# Patient Record
Sex: Male | Born: 1955 | Race: White | Hispanic: No | Marital: Married | Smoking: Never smoker
Health system: Southern US, Community
[De-identification: ages and names within clinical notes are randomized; demographics above are authoritative.]

## PROBLEM LIST (undated history)

## (undated) DIAGNOSIS — K635 Polyp of colon: Secondary | ICD-10-CM

## (undated) HISTORY — DX: Polyp of colon: K63.5

## (undated) HISTORY — PX: BACK SURGERY: SHX140

## (undated) HISTORY — PX: KNEE SURGERY: SHX244

---

## 2004-09-30 ENCOUNTER — Ambulatory Visit: Payer: Self-pay | Admitting: Family Medicine

## 2005-12-20 ENCOUNTER — Encounter: Payer: Self-pay | Admitting: Gastroenterology

## 2006-01-07 ENCOUNTER — Encounter: Payer: Self-pay | Admitting: Gastroenterology

## 2006-02-17 ENCOUNTER — Encounter: Payer: Self-pay | Admitting: Gastroenterology

## 2006-09-08 ENCOUNTER — Ambulatory Visit (HOSPITAL_COMMUNITY): Admission: RE | Admit: 2006-09-08 | Discharge: 2006-09-08 | Payer: Self-pay | Admitting: Orthopedic Surgery

## 2007-06-12 ENCOUNTER — Ambulatory Visit: Payer: Self-pay | Admitting: Gastroenterology

## 2007-06-14 ENCOUNTER — Encounter: Payer: Self-pay | Admitting: Gastroenterology

## 2007-06-14 ENCOUNTER — Ambulatory Visit: Payer: Self-pay | Admitting: Gastroenterology

## 2007-06-14 DIAGNOSIS — K449 Diaphragmatic hernia without obstruction or gangrene: Secondary | ICD-10-CM | POA: Insufficient documentation

## 2007-06-24 DIAGNOSIS — K210 Gastro-esophageal reflux disease with esophagitis: Secondary | ICD-10-CM

## 2007-08-14 ENCOUNTER — Ambulatory Visit: Payer: Self-pay | Admitting: Gastroenterology

## 2007-10-05 DIAGNOSIS — K219 Gastro-esophageal reflux disease without esophagitis: Secondary | ICD-10-CM | POA: Insufficient documentation

## 2007-10-05 DIAGNOSIS — K5730 Diverticulosis of large intestine without perforation or abscess without bleeding: Secondary | ICD-10-CM | POA: Insufficient documentation

## 2008-07-24 DIAGNOSIS — Z860100 Personal history of colon polyps, unspecified: Secondary | ICD-10-CM | POA: Insufficient documentation

## 2008-07-24 DIAGNOSIS — M129 Arthropathy, unspecified: Secondary | ICD-10-CM | POA: Insufficient documentation

## 2008-07-24 DIAGNOSIS — M5126 Other intervertebral disc displacement, lumbar region: Secondary | ICD-10-CM

## 2008-07-25 ENCOUNTER — Ambulatory Visit: Payer: Self-pay | Admitting: Gastroenterology

## 2008-08-30 ENCOUNTER — Ambulatory Visit (HOSPITAL_COMMUNITY): Admission: RE | Admit: 2008-08-30 | Discharge: 2008-08-30 | Payer: Self-pay | Admitting: Orthopedic Surgery

## 2009-11-05 IMAGING — CR DG ORBITS FOR FOREIGN BODY
2 series · 2 of 2 positions shown · non-contrast
Comparison: 09/08/2006 radiographs.

CLINICAL DATA: Pre MRI orbital screening.

ORBITS FOR FOREIGN BODY - 2 VIEW

[view not recorded (1 of 2)]
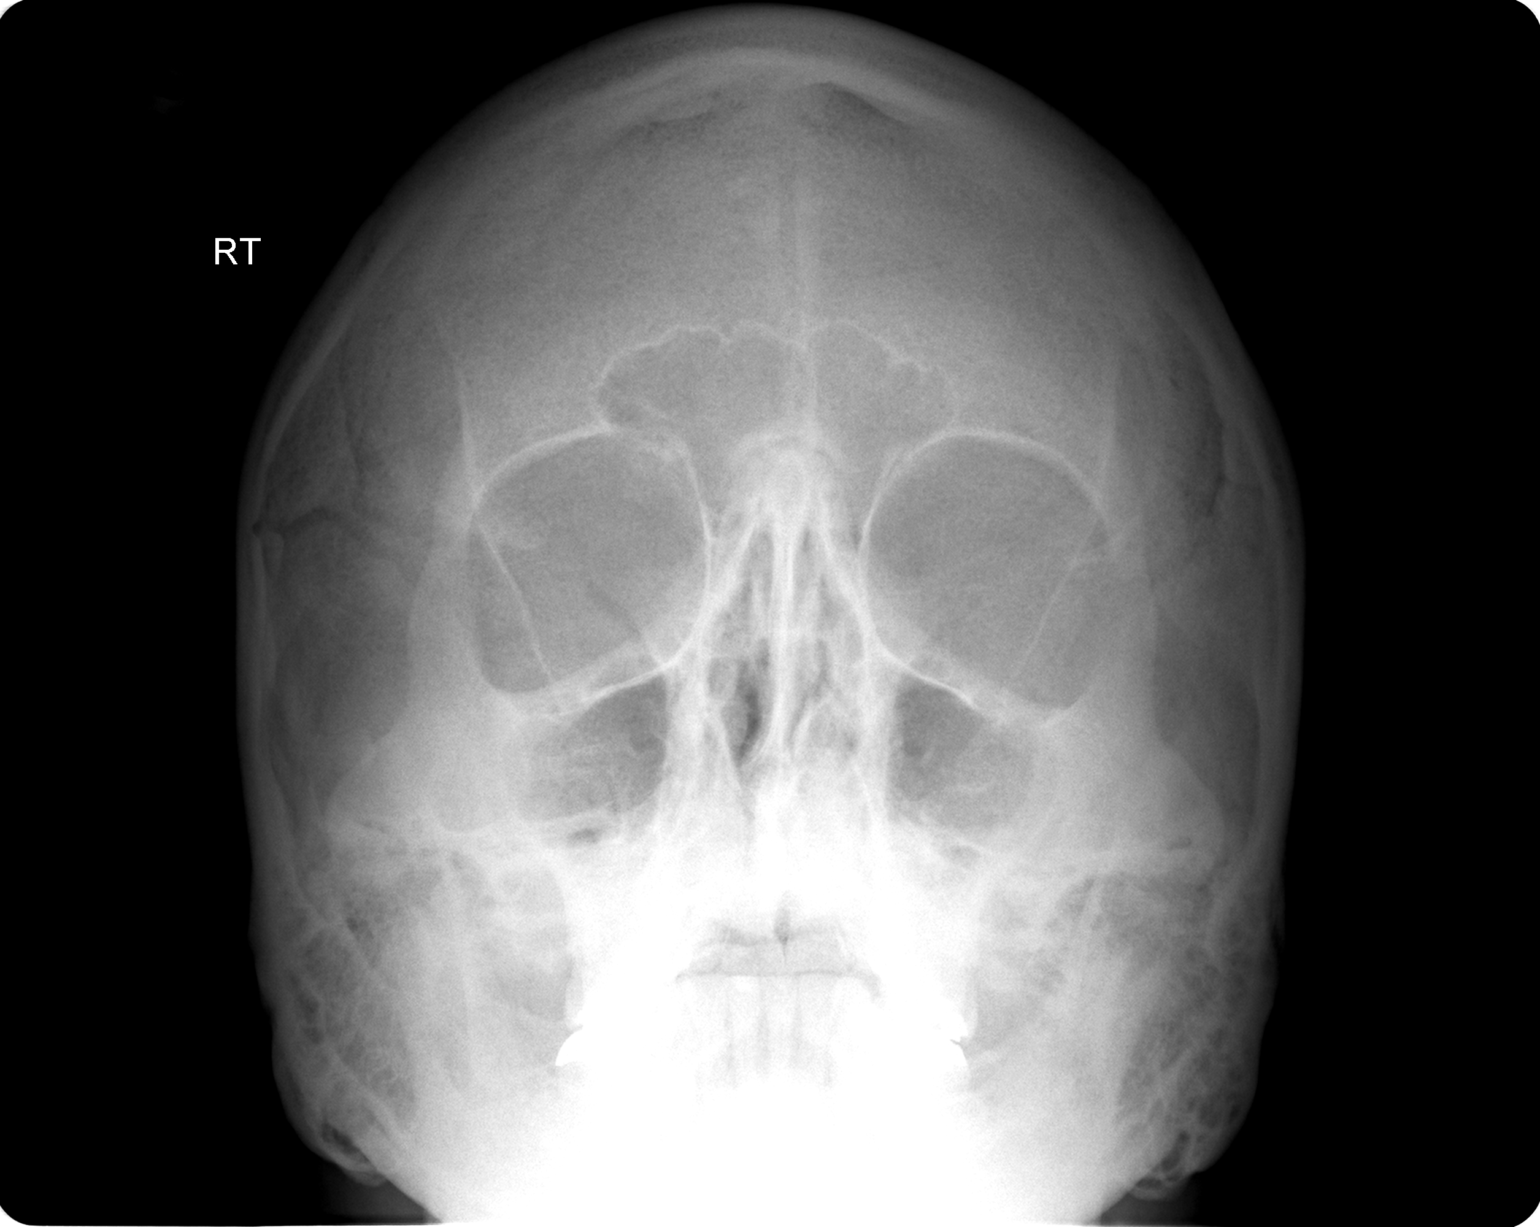

[view not recorded (2 of 2)]
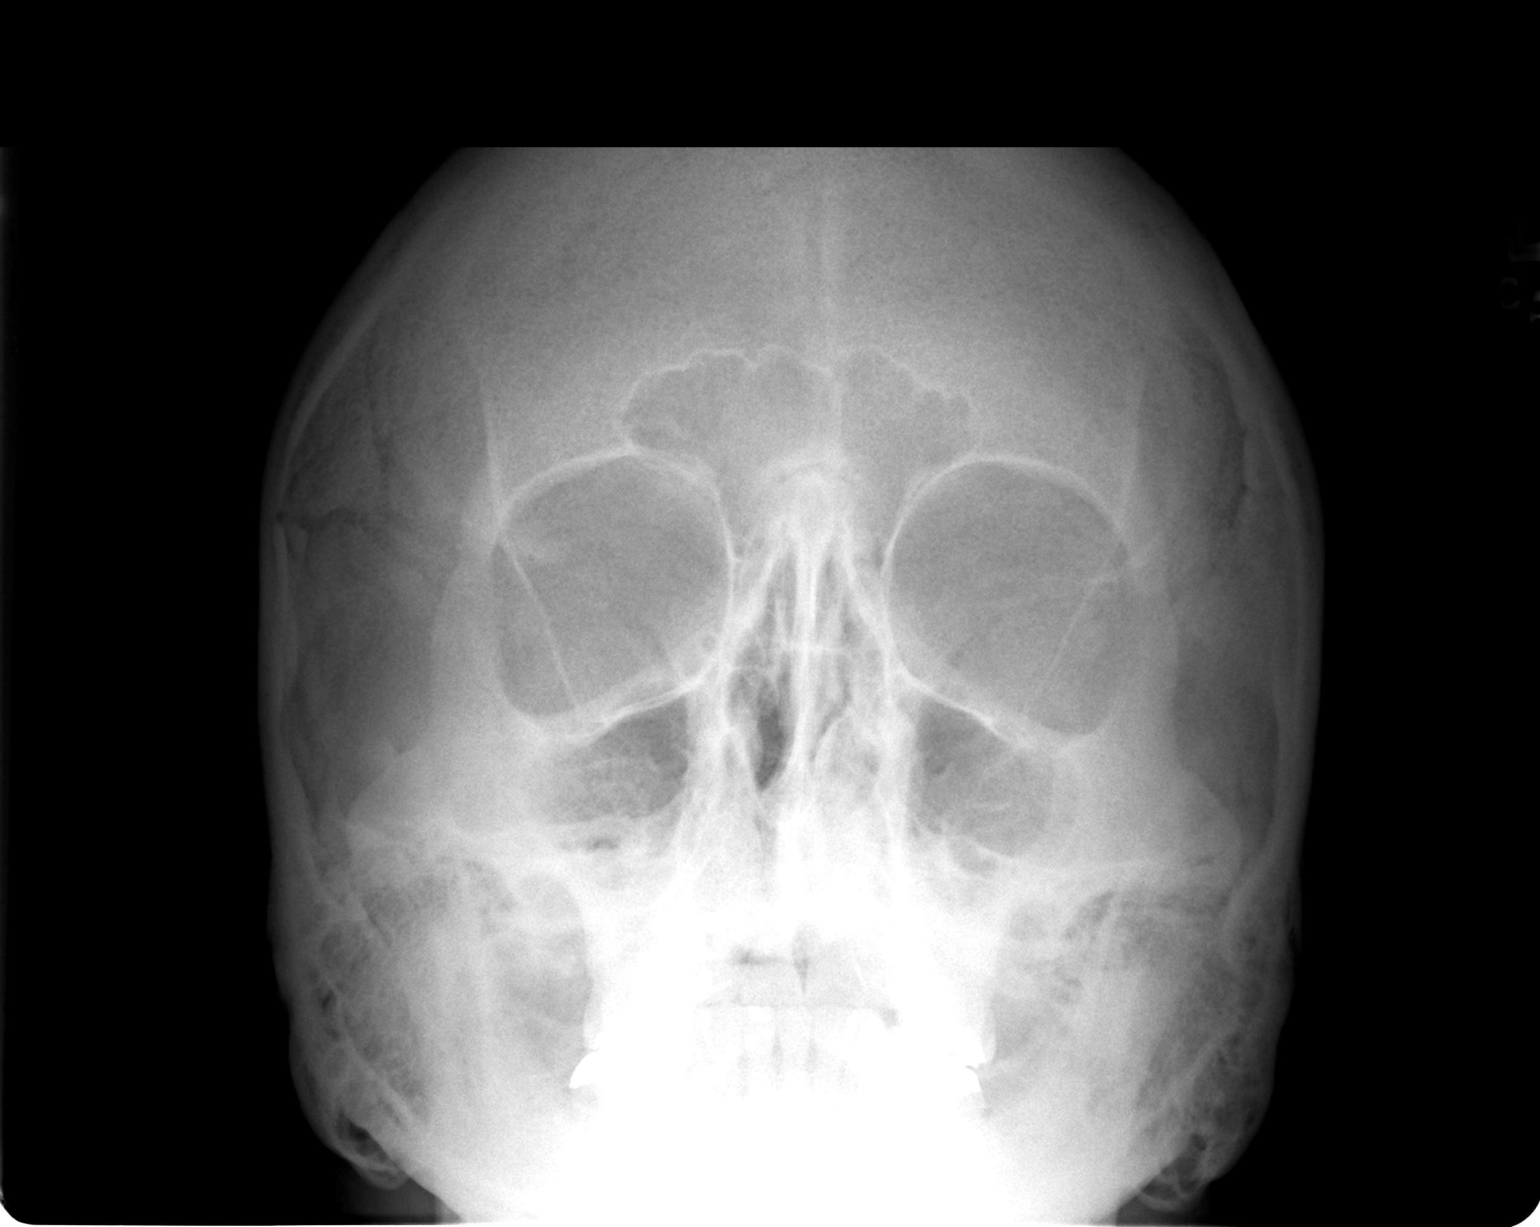

[2 of 2 positions shown; findings below may reference images not displayed]

FINDINGS: There is no evidence of intraorbital metallic foreign
body.  Possible maxillary sinus mucosal thickening is demonstrated
bilaterally.
IMPRESSION: No evidence of intraorbital metallic foreign body.

## 2010-12-22 NOTE — Assessment & Plan Note (Signed)
[facility]                         GASTROENTEROLOGY OFFICE NOTE   NAME:POOLEKashus, [name]                     MRN:          [id number]  DATE:06/12/2007                            DOB:          17-Oct-1955    CHIEF COMPLAINT:  This is A 55 year old white male self referred for  evaluation of GERD.  Mr. Ambrosia relates about a 4-5 year history of post  prandial and nocturnal burning epigastric and some sternal pain.  He has  had frequent problems with postprandial and nighttime cough.  He has  lost about 25 pounds over the past 1-2 years and his reflux symptoms  have improved but have not abated.  He has not been treated with any  antireflux medications.  He notes no dysphagia, odynophagia, change in  bowel habits, melena or hematochezia.  He states he underwent a  colonoscopy by Dr. Chales Abrahams in [place] in 2006 that showed a small  hyperplastic colon polyp and mild diverticulosis and a colonoscopy in 10  years was recommended, by the patients report .   PAST MEDICAL HISTORY:  1. Arthritis.  2. Lumbar spine bulging disk.   CURRENT MEDICATIONS:  None.   MEDICATION ALLERGIES:  NONE KNOWN.   SOCIAL HISTORY:  Per the handwritten form.   REVIEW OF SYSTEMS:  Per the handwritten form.   PHYSICAL EXAMINATION:  Well-developed, well-nourished, no acute  distress.  Height 5 feet 7 inches, weight 163.4 pounds, blood pressure  is 98/60, pulse 60 and regular.  HEENT:  Anicteric sclerae, oropharynx clear.  CHEST:  Clear to auscultation bilaterally.  CARDIAC:  Regular rate and rhythm without murmurs.  ABDOMEN:  Soft, nontender, nondistended, normoactive bowel sounds.  No  palpable organomegaly, masses or hernias.  NEUROLOGIC:  Alert and oriented x3, grossly nonfocal.   ASSESSMENT/PLAN:  1. Gastroesophageal reflux disease.  Begin all standard dietary and      lifestyle measures for reflux.  Begin Prilosec OTC one p.o. q.a.m.      Rule out Barrett's esophagus,  erosive esophagitis and other      disorders.  Risks, benefits and alternatives to upper endoscopy and      possible biopsy discussed with the patient and he consents to      proceed.  This will be      scheduled electively.  2. Mild diverticulosis.  Long term high fiber diet with adequate fluid      intake.  3. Colorectal cancer screening.  Colonoscopy recommended in June 2016.     Venita Lick. Russella Dar, MD, [facility]  Electronically Signed    MTS/MedQ  DD: 06/12/2007  DT: 06/13/2007  Job #: [id number]

## 2010-12-22 NOTE — Assessment & Plan Note (Signed)
[facility]                         GASTROENTEROLOGY OFFICE NOTE   NAME:[name], [name]                     MRN:          [id number]  DATE:08/14/2007                            DOB:          10-01-1955    This is a return office visit for GERD with LA classification grade C  erosive esophagitis and a hiatal hernia.  His reflux symptoms have  improved substantially.  He still has a mild postprandial and nighttime  cough.   CURRENT MEDICATIONS:  Protonix 40 mg p.o. daily.   MEDICATION ALLERGIES:  None known.   EXAM:  No acute distress.  Weight 173.6 pounds.  Blood pressure is  102/64, pulse 64 and regular.  He is not reexamined.   ASSESSMENT AND PLAN:  Gastroesophageal reflux disease with grade C  erosive esophagitis.  He is strongly encouraged to use 4 inch bed blocks  and avoid any oral intake for 3-4 hours before bedtime.  He is advised  to follow all other antireflux measures as well.  He will call if his  cough does not resolve over the next 2-3 months.  Return office visit in  12 months.     Venita Lick. Russella Dar, MD, [facility]  Electronically Signed    MTS/MedQ  DD: 08/14/2007  DT: 08/14/2007  Job #: [id number]

## 2014-05-31 ENCOUNTER — Telehealth: Payer: Self-pay | Admitting: Gastroenterology

## 2014-05-31 NOTE — Telephone Encounter (Signed)
Patient will come in and see Doug SouJessica Zehr, PA on 06/05/14 2:00.  Appointment is scheduled with his mom

## 2014-06-05 ENCOUNTER — Ambulatory Visit (INDEPENDENT_AMBULATORY_CARE_PROVIDER_SITE_OTHER): Payer: Commercial Managed Care - PPO | Admitting: Gastroenterology

## 2014-06-05 ENCOUNTER — Encounter: Payer: Self-pay | Admitting: Gastroenterology

## 2014-06-05 VITALS — BP 130/80 | HR 74 | Ht 65.25 in | Wt 181.4 lb

## 2014-06-05 DIAGNOSIS — K2100 Gastro-esophageal reflux disease with esophagitis, without bleeding: Secondary | ICD-10-CM

## 2014-06-05 DIAGNOSIS — R05 Cough: Secondary | ICD-10-CM | POA: Insufficient documentation

## 2014-06-05 DIAGNOSIS — R059 Cough, unspecified: Secondary | ICD-10-CM

## 2014-06-05 MED ORDER — PANTOPRAZOLE SODIUM 40 MG PO TBEC: 40.0000 mg | DELAYED_RELEASE_TABLET | Freq: Every day | ORAL | Status: DC

## 2014-06-05 NOTE — Patient Instructions (Signed)
You have been scheduled for an endoscopy. Please follow written instructions given to you at your visit today. If you use inhalers (even only as needed), please bring them with you on the day of your procedure. Your physician has requested that you go to www.startemmi.com and enter the access code given to you at your visit today. This web site gives a general overview about your procedure. However, you should still follow specific instructions given to you by our office regarding your preparation for the procedure.  We have sent the following medications to your pharmacy for you to pick up at your convenience: Pantoprazole 40 mg, please take one tablet by mouth thirty minutes before dinner   Information on reflux is below for your review __________________________________________________________________________________________________________________________________________________________________  Gastroesophageal Reflux Disease, Adult Gastroesophageal reflux disease (GERD) happens when acid from your stomach flows up into the esophagus. When acid comes in contact with the esophagus, the acid causes soreness (inflammation) in the esophagus. Over time, GERD may create small holes (ulcers) in the lining of the esophagus. CAUSES   Increased body weight. This puts pressure on the stomach, making acid rise from the stomach into the esophagus.  Smoking. This increases acid production in the stomach.  Drinking alcohol. This causes decreased pressure in the lower esophageal sphincter (valve or ring of muscle between the esophagus and stomach), allowing acid from the stomach into the esophagus.  Late evening meals and a full stomach. This increases pressure and acid production in the stomach.  A malformed lower esophageal sphincter. Sometimes, no cause is found. SYMPTOMS   Burning pain in the lower part of the mid-chest behind the breastbone and in the mid-stomach area. This may occur twice a week  or more often.  Trouble swallowing.  Sore throat.  Dry cough.  Asthma-like symptoms including chest tightness, shortness of breath, or wheezing. DIAGNOSIS  Your caregiver may be able to diagnose GERD based on your symptoms. In some cases, X-rays and other tests may be done to check for complications or to check the condition of your stomach and esophagus. TREATMENT  Your caregiver may recommend over-the-counter or prescription medicines to help decrease acid production. Ask your caregiver before starting or adding any new medicines.  HOME CARE INSTRUCTIONS   Change the factors that you can control. Ask your caregiver for guidance concerning weight loss, quitting smoking, and alcohol consumption.  Avoid foods and drinks that make your symptoms worse, such as:  Caffeine or alcoholic drinks.  Chocolate.  Peppermint or mint flavorings.  Garlic and onions.  Spicy foods.  Citrus fruits, such as oranges, lemons, or limes.  Tomato-based foods such as sauce, chili, salsa, and pizza.  Fried and fatty foods.  Avoid lying down for the 3 hours prior to your bedtime or prior to taking a nap.  Eat small, frequent meals instead of large meals.  Wear loose-fitting clothing. Do not wear anything tight around your waist that causes pressure on your stomach.  Raise the head of your bed 6 to 8 inches with wood blocks to help you sleep. Extra pillows will not help.  Only take over-the-counter or prescription medicines for pain, discomfort, or fever as directed by your caregiver.  Do not take aspirin, ibuprofen, or other nonsteroidal anti-inflammatory drugs (NSAIDs). SEEK IMMEDIATE MEDICAL CARE IF:   You have pain in your arms, neck, jaw, teeth, or back.  Your pain increases or changes in intensity or duration.  You develop nausea, vomiting, or sweating (diaphoresis).  You develop shortness of breath,  or you faint.  Your vomit is green, yellow, black, or looks like coffee grounds or  blood.  Your stool is red, bloody, or black. These symptoms could be signs of other problems, such as heart disease, gastric bleeding, or esophageal bleeding. MAKE SURE YOU:   Understand these instructions.  Will watch your condition.  Will get help right away if you are not doing well or get worse. Document Released: 05/05/2005 Document Revised: 10/18/2011 Document Reviewed: 02/12/2011 [facility] Patient Information 2015 [employer] [place]. This information is not intended to replace advice given to you by your health care provider. Make sure you discuss any questions you have with your health care provider.

## 2014-06-05 NOTE — Progress Notes (Signed)
     06/05/2014 Derek LintsBobby Dean Raymond [id number] 01-Oct-1955   HISTORY OF PRESENT ILLNESS:  This is a pleasant 58 year old male who is previously known to Dr. Russella DarStark for GERD and EGD in 06/2007 at which time he was found to have a hiatal hernia and reflux esophagitis.  Biopsies of the esophagus showed benign mild inflammation c/w reflux.  He has not been on PPI for several years now.  He presents to our office today with complaints of cough and clearing of his throat that has been present for over a year.  He says that it gets worse after he eats.  Also gets reflux at night especially if he eats something too late; gets reflux with spaghetti sauce, etc as well.  Denies any dysphagia or odynophagia.  No nausea or vomiting; no abdominal pain.   Past Medical History  Diagnosis Date  . Colon polyps    Past Surgical History  Procedure Laterality Date  . Back surgery    . Knee surgery Right     reports that he has never smoked. He does not have any smokeless tobacco history on file. He reports that he drinks alcohol. He reports that he does not use illicit drugs. family history includes Diabetes in his father; Heart attack in his mother; Heart disease in his father. There is no history of Colon cancer, Colon polyps, Esophageal cancer, or Kidney disease. No Known Allergies    No outpatient encounter prescriptions on file as of 06/05/2014.     REVIEW OF SYSTEMS  : All other systems reviewed and negative except where noted in the History of Present Illness.   PHYSICAL EXAM: BP 130/80  Pulse 74  Ht 5' 5.25" (1.657 m)  Wt 181 lb 6 oz (82.271 kg)  BMI 29.96 kg/m2 General: Well developed white male in no acute distress Head: Normocephalic and atraumatic Eyes:  Sclerae anicteric, conjunctiva pink. Ears: Normal auditory acuity Lungs: Clear throughout to auscultation Heart: Regular rate and rhythm Abdomen: Soft, non-distended.  Normal bowel sounds.  Non-tender. Musculoskeletal: Symmetrical with  no gross deformities  Skin: No lesions on visible extremities Extremities: No edema  Neurological: Alert oriented x 4, grossly non-focal Psychological:  Alert and cooperative. Normal mood and affect  ASSESSMENT AND PLAN: -58 year old male with history of GERD and reflux esophagitis on previous EGD.  Now with complaints of coughing and clearing his throat.  Not on PPI for several years.  Will start pantoprazole 40 mg daily before dinner since symptoms are worse in the evening and at night.  GERD reflux measures.  Will schedule for repeat EGD as well to reassess, rule out Barrett's from long-standing untreated reflux, etc.

## 2014-06-05 NOTE — Progress Notes (Signed)
Reviewed and agree with management plan.  Marston Mccadden T. Makyi Ledo, MD FACG 

## 2014-06-06 ENCOUNTER — Encounter: Payer: Self-pay | Admitting: Gastroenterology

## 2014-07-24 ENCOUNTER — Telehealth: Payer: Self-pay | Admitting: Gastroenterology

## 2014-07-24 NOTE — Telephone Encounter (Signed)
Patient notified that as long as he is not running fever he is ok to keep appt.  He wants to keep the appt as scheduled

## 2014-07-26 ENCOUNTER — Encounter: Payer: Self-pay | Admitting: Gastroenterology

## 2014-07-26 ENCOUNTER — Ambulatory Visit (AMBULATORY_SURGERY_CENTER): Payer: Commercial Managed Care - PPO | Admitting: Gastroenterology

## 2014-07-26 VITALS — BP 123/87 | HR 61 | Temp 96.5°F | Resp 16 | Ht 65.25 in | Wt 181.0 lb

## 2014-07-26 DIAGNOSIS — K209 Esophagitis, unspecified: Secondary | ICD-10-CM

## 2014-07-26 DIAGNOSIS — K2100 Gastro-esophageal reflux disease with esophagitis, without bleeding: Secondary | ICD-10-CM

## 2014-07-26 MED ORDER — OMEPRAZOLE 40 MG PO CPDR: 40.0000 mg | DELAYED_RELEASE_CAPSULE | Freq: Two times a day (BID) | ORAL | Status: DC

## 2014-07-26 MED ORDER — SODIUM CHLORIDE 0.9 % IV SOLN: 500.0000 mL | INTRAVENOUS | Status: DC

## 2014-07-26 NOTE — Progress Notes (Signed)
Report to PACU, RN, vss, BBS= Clear.  

## 2014-07-26 NOTE — Progress Notes (Signed)
Called to room to assist during endoscopic procedure.  Patient ID and intended procedure confirmed with present staff. Received instructions for my participation in the procedure from the performing physician.  

## 2014-07-26 NOTE — Op Note (Signed)
[facility] [address] [place], [id number]   ENDOSCOPY PROCEDURE REPORT  PATIENT: [name], [name]  MR#: [id number] BIRTHDATE: 1956/02/19 , 58  yrs. old GENDER: male ENDOSCOPIST: Meryl DareMalcolm T Song Garris, MD, [facility] PROCEDURE DATE:  07/26/2014 PROCEDURE:  EGD w/ biopsy ASA CLASS:     Class II INDICATIONS:  history of reflux esophagitis. MEDICATIONS: Monitored anesthesia care and Propofol 200 mg IV TOPICAL ANESTHETIC: none DESCRIPTION OF PROCEDURE: After the risks benefits and alternatives of the procedure were thoroughly explained, informed consent was obtained.  The LB [id number] endoscope was introduced through the mouth and advanced to the second portion of the duodenum , Without limitations.  The instrument was slowly withdrawn as the mucosa was fully examined.    ESOPHAGUS: There was LA Class C esophagitis (Mucosal breaks continuous between > 2 mucosal folds, but involving less than 75% of the esophageal circumference) noted.   The esophagus was otherwise normal.   Multiple biopsies of esophagitis were performed. STOMACH: The mucosa of the stomach appeared normal. DUODENUM: The duodenal mucosa showed no abnormalities in the bulb and 2nd part of the duodenum.  Retroflexed views revealed a 4 cm hiatal hernia.   The scope was then withdrawn from the patient and the procedure completed.  COMPLICATIONS: There were no immediate complications.  ENDOSCOPIC IMPRESSION: 1.   LA Class C esophagitis 2.   4 cm hiatal hernia  RECOMMENDATIONS: 1.  Anti-reflux regimen long term 2.  Await pathology results 3.  PPI bid: omeprazole 40 mg po bid (ac), 1 year of refills 4.  OP follow-up in 4-6 weeks  eSigned:  Meryl DareMalcolm T Neala Miggins, MD, [facility] 07/26/2014 2:54 PM

## 2014-07-26 NOTE — Patient Instructions (Addendum)

## 2014-07-29 ENCOUNTER — Telehealth: Payer: Self-pay

## 2014-07-29 NOTE — Telephone Encounter (Signed)
Left a message at [phone] for the pt to call us back if any questions or concerns. maw

## 2014-08-05 ENCOUNTER — Encounter: Payer: Self-pay | Admitting: Gastroenterology

## 2015-12-15 ENCOUNTER — Encounter: Payer: Self-pay | Admitting: Gastroenterology

## 2019-08-10 HISTORY — PX: SHOULDER SURGERY: SHX246

## 2023-08-16 DIAGNOSIS — D170 Benign lipomatous neoplasm of skin and subcutaneous tissue of head, face and neck: Secondary | ICD-10-CM | POA: Diagnosis not present

## 2023-08-16 DIAGNOSIS — K219 Gastro-esophageal reflux disease without esophagitis: Secondary | ICD-10-CM | POA: Diagnosis not present

## 2023-08-16 DIAGNOSIS — R1310 Dysphagia, unspecified: Secondary | ICD-10-CM | POA: Diagnosis not present

## 2023-08-26 ENCOUNTER — Encounter: Payer: Self-pay | Admitting: Gastroenterology

## 2023-10-06 NOTE — Progress Notes (Signed)
 Chief Complaint:dysphagia Primary GI Doctor:(Previously [name]) requests [name]  HPI:  Patient is a  68 year old male patient with past medical history of diverticulosis, hiatal hernia, GERD with esophagitis, who was referred to me by Derek Payment., MD on 08/17/23 for a complaint of dysphagia .    06/05/2014 patient last seen by Derek Bumps, PA with complaints of cough and clearing of his throat.  He also complained of reflux especially at night.  Patient scheduled for EGD and placed on pantoprazole 40 mg p.o. daily.  08/17/2023 PCP mentions patient having issues with dysphagia.  He also mentions lesion on the back of his neck that is a benign looking lipoma.  Not causing him significant pain or discomfort.  They did discuss indications for surgical excision or removal.  Interval History    Patient presents with main complaint of esophageal dysphagia. Patient has a history of GERD and currently taking omeprazole 40 mg p.o. daily. He states he started it about a month ago when he told his PCP about his dysphagia. His primary problem is esophageal dysphagia for the past year, but it has progressively gotten worse. The Omeprazole has helped.Patient complains of esophageal dysphagia with solids only. Occasional regurgitation during the night when sleeping. Patient denies nausea, vomiting, or weight loss. Patient reports he also has issues with having complete bowel movement and constipation. He states at times he feels like he needs to go, but doesn't. He has daily bowel movement, strains at times. Sometimes he has large bowel movement, other times very small amount. No blood in stool. No abdominal pain. No blood thinners. Nonsmoker. Socially drinks, very seldom. Patient's family history includes sister with breast CA.  Wt Readings from Last 3 Encounters:  10/10/23 175 lb 2 oz (79.4 kg)  07/26/14 181 lb (82.1 kg)  06/05/14 181 lb 6 oz (82.3 kg)    Past Medical History:  Diagnosis Date   Colon  polyps    Past Surgical History:  Procedure Laterality Date   BACK SURGERY     KNEE SURGERY Right    2 times   SHOULDER SURGERY Right 2021   Current Outpatient Medications  Medication Sig Dispense Refill   omeprazole (PRILOSEC) 40 MG capsule Take 1 capsule (40 mg total) by mouth 2 (two) times daily before a meal. 90 capsule 3   No current facility-administered medications for this visit.    Allergies as of 10/10/2023 - Review Complete 07/26/2014  Allergen Reaction Noted   Pantoprazole Nausea Only 07/26/2014    Family History  Problem Relation Age of Onset   Heart attack Mother    Diabetes Father    Heart disease Father    Breast cancer Sister    Colon cancer Neg Hx    Colon polyps Neg Hx    Esophageal cancer Neg Hx    Kidney disease Neg Hx     Review of Systems:    Constitutional: No weight loss, fever, chills, weakness or fatigue HEENT: Eyes: No change in vision               Ears, Nose, Throat:  No change in hearing or congestion Skin: No rash or itching Cardiovascular: No chest pain, chest pressure or palpitations   Respiratory: No SOB or cough Gastrointestinal: See HPI and otherwise negative Genitourinary: No dysuria or change in urinary frequency Neurological: No headache, dizziness or syncope Musculoskeletal: No new muscle or joint pain Hematologic: No bleeding or bruising Psychiatric: No history of depression or anxiety  Physical Exam:  Vital signs: BP 120/68   Pulse 60   Ht 5\' 5"  (1.651 m)   Wt 175 lb 2 oz (79.4 kg)   BMI 29.14 kg/m   Constitutional:   Pleasant Caucasian male appears to be in NAD, Well developed, Well nourished, alert and cooperative Throat: Oral cavity and pharynx without inflammation, swelling or lesion.  Respiratory: Respirations even and unlabored. Lungs clear to auscultation bilaterally.   No wheezes, crackles, or rhonchi.  Cardiovascular: Normal S1, S2. Regular rate and rhythm. No peripheral edema, cyanosis or pallor.   Gastrointestinal:  Soft, nondistended, nontender. No rebound or guarding. Normal bowel sounds. No appreciable masses or hepatomegaly. Rectal:  Not performed.  Msk:  Symmetrical without gross deformities. Without edema, no deformity or joint abnormality.  Neurologic:  Alert and  oriented x4;  grossly normal neurologically.  Skin:   Dry and intact without significant lesions or rashes. Psychiatric: Oriented to person, place and time. Demonstrates good judgement and reason without abnormal affect or behaviors.  RELEVANT LABS AND IMAGING: Procedures: 07/26/2014 upper endoscopy with [name] Impression LA class C esophagitis 4 cm hiatal hernia 06/14/2007 upper endoscopy with [name] Esophageal: severe ulcerations present [facility] classification grade C biopsy/E soft inflammation taken Hiatal hernia; regular 4 cm in length Normal fundus to duodenal second portion 02/17/2006 colonoscopy with [name] Findings Pan colitic diverticulosis, predominantly in the left colon Otherwise normal colonoscopy of terminal ileum  Assessment: Encounter Diagnoses  Name Primary?   Gastroesophageal reflux disease with esophagitis without hemorrhage Yes   Esophageal dysphagia    Special screening for malignant neoplasms, colon    Chronic idiopathic constipation       68 year old male patient with history of GERD with esophagitis who presents with worsening esophageal dysphagia for the past year.  Patient has not been on any PPI therapy.  PCP started him on omeprazole 40 mg p.o. daily 1 month ago.  Patient states his symptoms have improved some.  Patient has nocturnal symptoms with regurgitation.  Will go ahead and schedule upper GI endoscopy to rule out Barrett's esophagus.  Reinforced strict GERD diet no late meals.     Patient also complains of mild constipation and we discussed adding Citrucel 1 teaspoon p.o. daily.  Patient can use MiraLAX as needed.    Patient due for colon screening colonoscopy, his  last colonoscopy was with [name] in 2007 and normal.  We will use a 2-day prep.  Plan: -Continue Omeprazole 40 mg po daily -Strict GERD diet, no meals 3 to 4 hours before lying down -Start Citrucel 1 tsp po daily -OTC Miralax po daily as needed -Schedule EGD with possible dilatation in [facility] with [name].The risks and benefits of EGD with possible biopsies and esophageal dilation were discussed with the patient who agrees to proceed. -Schedule a colonoscopy in [facility] with [name]. Two day prep. The risks and benefits of colonoscopy with possible polypectomy / biopsies were discussed and the patient agrees to proceed.    Thank you for the courtesy of this consult. Please call me with any questions or concerns.   Derek Hainer, FNP-C [facility] 10/10/2023, 9:34 AM  Cc: Derek Payment., MD

## 2023-10-10 ENCOUNTER — Encounter: Payer: Self-pay | Admitting: Gastroenterology

## 2023-10-10 ENCOUNTER — Ambulatory Visit (INDEPENDENT_AMBULATORY_CARE_PROVIDER_SITE_OTHER): Payer: Commercial Managed Care - PPO | Admitting: Gastroenterology

## 2023-10-10 VITALS — BP 120/68 | HR 60 | Ht 65.0 in | Wt 175.1 lb

## 2023-10-10 DIAGNOSIS — R1319 Other dysphagia: Secondary | ICD-10-CM

## 2023-10-10 DIAGNOSIS — K5904 Chronic idiopathic constipation: Secondary | ICD-10-CM | POA: Diagnosis not present

## 2023-10-10 DIAGNOSIS — Z1211 Encounter for screening for malignant neoplasm of colon: Secondary | ICD-10-CM

## 2023-10-10 DIAGNOSIS — K2100 Gastro-esophageal reflux disease with esophagitis, without bleeding: Secondary | ICD-10-CM

## 2023-10-10 DIAGNOSIS — R1310 Dysphagia, unspecified: Secondary | ICD-10-CM

## 2023-10-10 MED ORDER — SUFLAVE 178.7 G PO SOLR: 1.0000 | Freq: Once | ORAL | 0 refills | Status: AC

## 2023-10-10 NOTE — Progress Notes (Signed)
 Agree with assessment/plan.  Edman Circle, MD [facility] [phone]

## 2023-10-10 NOTE — Patient Instructions (Addendum)
 Continue Omeprazole 40 mg po daily. Strict GERD diet, no meals 3 to 4 hours before lying down. Start Citrucel 1 teaspoon po daily. Over the counter Miralax po daily as needed for constipation  You have been scheduled for an endoscopy and colonoscopy. Please follow the written instructions given to you at your visit today.  If you use inhalers (even only as needed), please bring them with you on the day of your procedure.  DO NOT TAKE 7 DAYS PRIOR TO TEST- Trulicity (dulaglutide) Ozempic, Wegovy (semaglutide) Mounjaro (tirzepatide) Bydureon Bcise (exanatide extended release)  DO NOT TAKE 1 DAY PRIOR TO YOUR TEST Rybelsus (semaglutide) Adlyxin (lixisenatide) Victoza (liraglutide) Byetta (exanatide) ___________________________________________________________________________  Bonita Quin will receive your bowel preparation through [facility], which ensures the lowest copay and home delivery, with outreach via text or call from an [phone] number. Please respond promptly to avoid rescheduling of your procedure. If you are interested in alternative options or have any questions regarding your prep, please contact them at [phone] ____________________________________________________________________________  Your Provider Has Sent Your Bowel Prep Regimen To [facility]   [facility] will contact you to verify your information and collect your copay, if applicable. Enjoy the comfort of your home while your prescription is mailed to you, FREE of any shipping charges.   [facility] accepts all major insurance benefits and applies discounts & coupons.  Have additional questions?   Chat: [email or web address] Call: [phone] Email: [email or web address] NCPDP: [id number]  How will [facility] contact you?  With a Welcome phone call,  a Welcome text and a checkout link in text form.  Texts you receive from [phone] Are NOT Spam.  *To set up delivery, you must complete the checkout  process via link or speak to one of the patient care representatives. If [facility] is unable to reach you, your prescription may be delayed.  To avoid long hold times on the phone, you may also utilize the secure chat feature on the [facility] website to request that they call you back for transaction completion or to expedite your concerns.  _______________________________________________________  If your blood pressure at your visit was 140/90 or greater, please contact your primary care physician to follow up on this.  _______________________________________________________  If you are age 66 or older, your body mass index should be between 23-30. Your Body mass index is 29.14 kg/m. If this is out of the aforementioned range listed, please consider follow up with your Primary Care Provider.  If you are age 80 or younger, your body mass index should be between 19-25. Your Body mass index is 29.14 kg/m. If this is out of the aformentioned range listed, please consider follow up with your Primary Care Provider.   ________________________________________________________  The [facility] providers would like to encourage you to use [facility] to communicate with providers for non-urgent requests or questions.  Due to long hold times on the telephone, sending your provider a message by [facility] may be a faster and more efficient way to get a response.  Please allow 48 business hours for a response.  Please remember that this is for non-urgent requests.  _______________________________________________________  Due to recent changes in healthcare laws, you may see the results of your imaging and laboratory studies on MyChart before your provider has had a chance to review them.  We understand that in some cases there may be results that are confusing or concerning to you. Not all laboratory results come back in the same time frame and the provider may be waiting for multiple results  in order to interpret  others.  Please give [place] 48 hours in order for your provider to thoroughly review all the results before contacting the office for clarification of your results.   Thank you for entrusting me with your care and choosing [facility].  Deanna May NP

## 2023-12-12 ENCOUNTER — Encounter: Payer: Self-pay | Admitting: Gastroenterology

## 2023-12-12 ENCOUNTER — Ambulatory Visit (AMBULATORY_SURGERY_CENTER): Admitting: Gastroenterology

## 2023-12-12 VITALS — BP 120/74 | HR 55 | Temp 97.9°F | Resp 12 | Ht 65.0 in | Wt 175.0 lb

## 2023-12-12 DIAGNOSIS — K2950 Unspecified chronic gastritis without bleeding: Secondary | ICD-10-CM | POA: Diagnosis not present

## 2023-12-12 DIAGNOSIS — K449 Diaphragmatic hernia without obstruction or gangrene: Secondary | ICD-10-CM | POA: Diagnosis not present

## 2023-12-12 DIAGNOSIS — Z1211 Encounter for screening for malignant neoplasm of colon: Secondary | ICD-10-CM | POA: Diagnosis not present

## 2023-12-12 DIAGNOSIS — K209 Esophagitis, unspecified without bleeding: Secondary | ICD-10-CM

## 2023-12-12 DIAGNOSIS — K5730 Diverticulosis of large intestine without perforation or abscess without bleeding: Secondary | ICD-10-CM | POA: Diagnosis not present

## 2023-12-12 DIAGNOSIS — K2210 Ulcer of esophagus without bleeding: Secondary | ICD-10-CM

## 2023-12-12 DIAGNOSIS — K2100 Gastro-esophageal reflux disease with esophagitis, without bleeding: Secondary | ICD-10-CM

## 2023-12-12 DIAGNOSIS — K222 Esophageal obstruction: Secondary | ICD-10-CM | POA: Diagnosis not present

## 2023-12-12 DIAGNOSIS — K640 First degree hemorrhoids: Secondary | ICD-10-CM

## 2023-12-12 MED ORDER — SODIUM CHLORIDE 0.9 % IV SOLN: 500.0000 mL | Freq: Once | INTRAVENOUS | Status: DC

## 2023-12-12 MED ORDER — OMEPRAZOLE 40 MG PO CPDR: 40.0000 mg | DELAYED_RELEASE_CAPSULE | Freq: Two times a day (BID) | ORAL | 4 refills | Status: AC

## 2023-12-12 NOTE — Progress Notes (Signed)
 Called to room to assist during endoscopic procedure.  Patient ID and intended procedure confirmed with present staff. Received instructions for my participation in the procedure from the performing physician.

## 2023-12-12 NOTE — Op Note (Signed)
 [facility] Patient Name: [name] Procedure Date: 12/12/2023 9:06 AM MRN: [id number] Endoscopist: Lajuan Pila , MD, [id number] Age: 68 Referring MD:  Date of Birth: 11/26/1955 Gender: Male Account #: [id number] Procedure:                Colonoscopy Indications:              Screening for colorectal malignant neoplasm Medicines:                Monitored Anesthesia Care Procedure:                Pre-Anesthesia Assessment:                           - Prior to the procedure, a History and Physical                            was performed, and patient medications and                            allergies were reviewed. The patient's tolerance of                            previous anesthesia was also reviewed. The risks                            and benefits of the procedure and the sedation                            options and risks were discussed with the patient.                            All questions were answered, and informed consent                            was obtained. Prior Anticoagulants: The patient has                            taken no anticoagulant or antiplatelet agents. ASA                            Grade Assessment: II - A patient with mild systemic                            disease. After reviewing the risks and benefits,                            the patient was deemed in satisfactory condition to                            undergo the procedure.                           After obtaining informed consent, the colonoscope  was passed under direct vision. Throughout the                            procedure, the patient's blood pressure, pulse, and                            oxygen saturations were monitored continuously. The                            Olympus Scope [id number] was introduced through the                            anus and advanced to the 2 cm into the ileum. The                            colonoscopy was  performed without difficulty. The                            patient tolerated the procedure well. The quality                            of the bowel preparation was good. The terminal                            ileum, ileocecal valve, appendiceal orifice, and                            rectum were photographed. Scope In: 9:12:27 AM Scope Out: 9:22:26 AM Scope Withdrawal Time: 0 hours 7 minutes 10 seconds  Total Procedure Duration: 0 hours 9 minutes 59 seconds  Findings:                 Multiple medium-mouthed diverticula were found in                            the sigmoid colon, few in descending colon and                            ascending colon.                           Non-bleeding internal hemorrhoids were found during                            retroflexion. The hemorrhoids were small and Grade                            I (internal hemorrhoids that do not prolapse).                           The terminal ileum appeared normal.                           The exam was otherwise without abnormality on  direct and retroflexion views. Complications:            No immediate complications. Estimated Blood Loss:     Estimated blood loss: none. Impression:               - Pancolonic diverticulosis predominantly in the                            sigmoid colon.                           - Non-bleeding internal hemorrhoids.                           - The examined portion of the ileum was normal.                           - The examination was otherwise normal on direct                            and retroflexion views.                           - No specimens collected. Recommendation:           - Patient has a contact number available for                            emergencies. The signs and symptoms of potential                            delayed complications were discussed with the                            patient. Return to normal activities tomorrow.                             Written discharge instructions were provided to the                            patient.                           - High fiber diet.                           - Continue present medications.                           - Repeat colonoscopy in 10 years for screening                            purposes. Earlier, if with any new problems or                            change in family history.                           -  The findings and recommendations were discussed                            with the patient's family. Lajuan Pila, MD 12/12/2023 9:31:02 AM This report has been signed electronically.

## 2023-12-12 NOTE — Patient Instructions (Addendum)
 SEE POST DILATION DIET HANDOUT, SOFT DIET FOR TODAY! Resume previous diet TOMORROW! Continue present medications, PICK UP NEW RX FOR PRILOSEC There were no colon polyps seen today!  You will need another screening colonoscopy in 10 years, you will receive a letter at that time when you are due for the procedure.   Please call us  at [phone] if you have a change in bowel habits, change in family history of colo-rectal cancer, rectal bleeding or other GI concern before that time. Await pathology results Repeat EGD in 12 weeks, you will get a reminder letter to schedule or call in the next few weeks when the schedule will open for early August.   Handouts/information given for Hiatal Hernia, stricture, esophagitis, Gastritis, diverticulosis and hemorrhoids  YOU HAD AN ENDOSCOPIC PROCEDURE TODAY AT THE [facility]:   Refer to the procedure report that was given to you for any specific questions about what was found during the examination.  If the procedure report does not answer your questions, please call your gastroenterologist to clarify.  If you requested that your care partner not be given the details of your procedure findings, then the procedure report has been included in a sealed envelope for you to review at your convenience later.  YOU SHOULD EXPECT: Some feelings of bloating in the abdomen. Passage of more gas than usual.  Walking can help get rid of the air that was put into your GI tract during the procedure and reduce the bloating. If you had a lower endoscopy (such as a colonoscopy or flexible sigmoidoscopy) you may notice spotting of blood in your stool or on the toilet paper. If you underwent a bowel prep for your procedure, you may not have a normal bowel movement for a few days.  Please Note:  You might notice some irritation and congestion in your nose or some drainage.  This is from the oxygen used during your procedure.  There is no need for concern and it should  clear up in a day or so.  SYMPTOMS TO REPORT IMMEDIATELY:  Following lower endoscopy (colonoscopy):  Excessive amounts of blood in the stool  Significant tenderness or worsening of abdominal pains  Swelling of the abdomen that is new, acute  Fever of 100F or higher Following upper endoscopy (EGD)  Vomiting of blood or coffee ground material  New chest pain or pain under the shoulder blades  Painful or persistently difficult swallowing  New shortness of breath  Black, tarry-looking stools For urgent or emergent issues, a gastroenterologist can be reached at any hour by calling [phone]. Do not use MyChart messaging for urgent concerns.   DIET: SEE POST DILATION DIET HANDOUT! SOFT DIET TODAY!   We do recommend a small meal at first, but then you may proceed to your regular diet.  Drink plenty of fluids but you should avoid alcoholic beverages for 24 hours.  ACTIVITY:  You should plan to take it easy for the rest of today and you should NOT DRIVE or use heavy machinery until tomorrow (because of the sedation medicines used during the test).    FOLLOW UP: Our staff will call the number listed on your records the next business day following your procedure.  We will call around 7:15- 8:00 am to check on you and address any questions or concerns that you may have regarding the information given to you following your procedure. If we do not reach you, we will leave a message.  If any biopsies were taken you will be contacted by phone or by letter within the next 1-3 weeks.  Please call us  at [phone] if you have not heard about the biopsies in 3 weeks.   SIGNATURES/CONFIDENTIALITY: You and/or your care partner have signed paperwork which will be entered into your electronic medical record.  These signatures attest to the fact that that the information above on your After Visit Summary has been reviewed and is understood.  Full responsibility of the confidentiality of this  discharge information lies with you and/or your care-partner.

## 2023-12-12 NOTE — Progress Notes (Signed)
 Chief Complaint:dysphagia Primary GI Doctor:(Previously Dr. Sandrea Cruel) requests Dr. Venice Gillis   HPI:  [name] is a  68 year old male [name] with past medical history of diverticulosis, hiatal hernia, GERD with esophagitis, who was referred to me by Abbe Hoard., MD on 08/17/23 for a complaint of dysphagia .     06/05/2014 [name] last seen by Camilo Cella, PA with complaints of cough and clearing of his throat.  He also complained of reflux especially at night.  [name] scheduled for EGD and placed on pantoprazole  40 mg p.o. daily.   08/17/2023 PCP mentions [name] having issues with dysphagia.  He also mentions lesion on the back of his neck that is a benign looking lipoma.  Not causing him significant pain or discomfort.  They did discuss indications for surgical excision or removal.   Interval History    [name] presents with main complaint of esophageal dysphagia. [name] has a history of GERD and currently taking omeprazole  40 mg p.o. daily. He states he started it about a month ago when he told his PCP about his dysphagia. His primary problem is esophageal dysphagia for the past year, but it has progressively gotten worse. The Omeprazole  has helped.[name] complains of esophageal dysphagia with solids only. Occasional regurgitation during the night when sleeping. [name] denies nausea, vomiting, or weight loss. [name] reports he also has issues with having complete bowel movement and constipation. He states at times he feels like he needs to go, but doesn't. He has daily bowel movement, strains at times. Sometimes he has large bowel movement, other times very small amount. No blood in stool. No abdominal pain. No blood thinners. Nonsmoker. Socially drinks, very seldom. [name]'s family history includes sister with breast CA.      Wt Readings from Last 3 Encounters:  10/10/23 175 lb 2 oz (79.4 kg)  07/26/14 181 lb (82.1 kg)  06/05/14 181 lb 6 oz (82.3 kg)        Past Medical History:  Diagnosis  Date   Colon polyps               Past Surgical History:  Procedure Laterality Date   BACK SURGERY       KNEE SURGERY Right      2 times   SHOULDER SURGERY Right 2021              Current Outpatient Medications  Medication Sig Dispense Refill   omeprazole  (PRILOSEC) 40 MG capsule Take 1 capsule (40 mg total) by mouth 2 (two) times daily before a meal. 90 capsule 3      No current facility-administered medications for this visit.             Allergies as of 10/10/2023 - Review Complete 07/26/2014  Allergen Reaction Noted   Pantoprazole  Nausea Only 07/26/2014           Family History  Problem Relation Age of Onset   Heart attack Mother     Diabetes Father     Heart disease Father     Breast cancer Sister     Colon cancer Neg Hx     Colon polyps Neg Hx     Esophageal cancer Neg Hx     Kidney disease Neg Hx            Review of Systems:    Constitutional: No weight loss, fever, chills, weakness or fatigue HEENT: Eyes: No change in vision  Ears, Nose, Throat:  No change in hearing or congestion Skin: No rash or itching Cardiovascular: No chest pain, chest pressure or palpitations   Respiratory: No SOB or cough Gastrointestinal: See HPI and otherwise negative Genitourinary: No dysuria or change in urinary frequency Neurological: No headache, dizziness or syncope Musculoskeletal: No new muscle or joint pain Hematologic: No bleeding or bruising Psychiatric: No history of depression or anxiety      Physical Exam:  Vital signs: BP 120/68   Pulse 60   Ht 5\' 5"  (1.651 m)   Wt 175 lb 2 oz (79.4 kg)   BMI 29.14 kg/m    Constitutional:   Pleasant Caucasian male appears to be in NAD, Well developed, Well nourished, alert and cooperative Throat: Oral cavity and pharynx without inflammation, swelling or lesion.  Respiratory: Respirations even and unlabored. Lungs clear to auscultation bilaterally.   No wheezes, crackles, or rhonchi.  Cardiovascular:  Normal S1, S2. Regular rate and rhythm. No peripheral edema, cyanosis or pallor.  Gastrointestinal:  Soft, nondistended, nontender. No rebound or guarding. Normal bowel sounds. No appreciable masses or hepatomegaly. Rectal:  Not performed.  Msk:  Symmetrical without gross deformities. Without edema, no deformity or joint abnormality.  Neurologic:  Alert and  oriented x4;  grossly normal neurologically.  Skin:   Dry and intact without significant lesions or rashes. Psychiatric: Oriented to person, place and time. Demonstrates good judgement and reason without abnormal affect or behaviors.   RELEVANT LABS AND IMAGING: Procedures: 07/26/2014 upper endoscopy with Dr. Sandrea Cruel Impression LA class C esophagitis 4 cm hiatal hernia 06/14/2007 upper endoscopy with Dr. Sandrea Cruel Esophageal: severe ulcerations present [facility] classification grade C biopsy/E soft inflammation taken Hiatal hernia; regular 4 cm in length Normal fundus to duodenal second portion 02/17/2006 colonoscopy with Dr. Venice Gillis Findings Pan colitic diverticulosis, predominantly in the left colon Otherwise normal colonoscopy of terminal ileum   Assessment:     Encounter Diagnoses  Name Primary?   Gastroesophageal reflux disease with esophagitis without hemorrhage Yes   Esophageal dysphagia     Special screening for malignant neoplasms, colon     Chronic idiopathic constipation        68 year old male [name] with history of GERD with esophagitis who presents with worsening esophageal dysphagia for the past year.  [name] has not been on any PPI therapy.  PCP started him on omeprazole  40 mg p.o. daily 1 month ago.  [name] states his symptoms have improved some.  [name] has nocturnal symptoms with regurgitation.  Will go ahead and schedule upper GI endoscopy to rule out Barrett's esophagus.  Reinforced strict GERD diet no late meals.     [name] also complains of mild constipation and we discussed adding Citrucel 1 teaspoon p.o.  daily.  [name] can use MiraLAX as needed.    [name] due for colon screening colonoscopy, his last colonoscopy was with Dr. Venice Gillis in 2007 and normal.  We will use a 2-day prep.   Plan: -Continue Omeprazole  40 mg po daily -Strict GERD diet, no meals 3 to 4 hours before lying down -Start Citrucel 1 tsp po daily -OTC Miralax po daily as needed -Schedule EGD with possible dilatation in [facility] with Dr. Venice Gillis.The risks and benefits of EGD with possible biopsies and esophageal dilation were discussed with the [name] who agrees to proceed. -Schedule a colonoscopy in [facility] with Dr. Venice Gillis. Two day prep. The risks and benefits of colonoscopy with possible polypectomy / biopsies were discussed and the [name] agrees to proceed.  Thank you for the courtesy of this consult. Please call me with any questions or concerns.    Deanna May, FNP-C [facility]    Attending physician's note   I have taken history, reviewed the chart and examined the [name]. I performed a substantive portion of this encounter, including complete performance of at least one of the key components, in conjunction with the APP. I agree with the Advanced Practitioner's note, impression and recommendations.    EGD with dil, colon today Continue current medications for now.   Magnus Schuller, MD [facility] [phone]

## 2023-12-12 NOTE — Progress Notes (Signed)
 Pt resting comfortably. VSS. Airway intact. SBAR complete to RN. All questions answered.

## 2023-12-12 NOTE — Progress Notes (Signed)
 Pt's states no medical or surgical changes since previsit or office visit.

## 2023-12-12 NOTE — Op Note (Signed)
 [facility] Patient Name: [name] Procedure Date: 12/12/2023 8:47 AM MRN: [id number] Endoscopist: Lajuan Pila , MD, [id number] Age: 68 Referring MD:  Date of Birth: 1956-01-04 Gender: Male Account #: [id number] Procedure:                Upper GI endoscopy Indications:              Dysphagia, Heartburn Medicines:                Monitored Anesthesia Care Procedure:                Pre-Anesthesia Assessment:                           - Prior to the procedure, a History and Physical                            was performed, and patient medications and                            allergies were reviewed. The patient's tolerance of                            previous anesthesia was also reviewed. The risks                            and benefits of the procedure and the sedation                            options and risks were discussed with the patient.                            All questions were answered, and informed consent                            was obtained. Prior Anticoagulants: The patient has                            taken no anticoagulant or antiplatelet agents. ASA                            Grade Assessment: II - A patient with mild systemic                            disease. After reviewing the risks and benefits,                            the patient was deemed in satisfactory condition to                            undergo the procedure.                           After obtaining informed consent, the endoscope was  passed under direct vision. Throughout the                            procedure, the patient's blood pressure, pulse, and                            oxygen saturations were monitored continuously. The                            GIF [id number] was introduced through the                            mouth, and advanced to the second part of duodenum.                            The upper GI endoscopy was accomplished  without                            difficulty. The patient tolerated the procedure                            well. Scope In: Scope Out: Findings:                 LA Grade D (one or more mucosal breaks involving at                            least 75% of esophageal circumference) esophagitis                            with no bleeding was found 32 to 35 cm from the                            incisors. Biopsies were taken with a cold forceps                            for histology. Estimated blood loss was minimal.                           One benign-appearing, intrinsic moderate stenosis                            was found 35 cm from the incisors. This stenosis                            measured 1.2 cm (inner diameter). The stenosis was                            traversed. A TTS dilator was passed through the                            scope. Dilation with a 15-16.5-18 mm balloon  dilator was performed to 18 mm. The dilation site                            was examined and showed mild mucosal disruption and                            complete resolution of luminal narrowing.                           A 3 cm hiatal hernia was present.                           Localized mild inflammation characterized by                            congestion (edema) and erythema was found in the                            gastric antrum. Biopsies were taken with a cold                            forceps for histology.                           The examined duodenum was normal. Complications:            No immediate complications. Estimated Blood Loss:     Estimated blood loss: none. Impression:               - LA Grade D reflux esophagitis with no bleeding.                            Biopsied.                           - Benign-appearing esophageal stenosis. Dilated.                           - 3 cm hiatal hernia.                           - Gastritis. Biopsied.                            - Normal examined duodenum. Recommendation:           - Patient has a contact number available for                            emergencies. The signs and symptoms of potential                            delayed complications were discussed with the                            patient. Return to normal activities tomorrow.  Written discharge instructions were provided to the                            patient.                           - Post dilatation diet.                           - Increase omeprazole  40 mg p.o. twice daily twice                            daily #90, 4RF                           - Await pathology results.                           - Repeat dilatation as needed.                           - Ideally would recommend repeating EGD in 12 weeks                            with biopsies to rule out underlying Barrett's                            esophagus. Patient will let us  know if he decides                            to go with that.                           - The findings and recommendations were discussed                            with the patient's family. Lajuan Pila, MD 12/12/2023 9:28:09 AM This report has been signed electronically.

## 2023-12-13 ENCOUNTER — Telehealth: Payer: Self-pay | Admitting: *Deleted

## 2023-12-13 NOTE — Telephone Encounter (Signed)
 Attempted post procedure follow up call.  No answer - LVM.

## 2023-12-14 LAB — SURGICAL PATHOLOGY

## 2023-12-26 ENCOUNTER — Ambulatory Visit: Payer: Self-pay | Admitting: Gastroenterology

## 2024-01-11 DIAGNOSIS — M25512 Pain in left shoulder: Secondary | ICD-10-CM | POA: Diagnosis not present

## 2024-02-27 ENCOUNTER — Encounter
